# Patient Record
Sex: Male | Born: 1980 | Race: White | Hispanic: No | Marital: Married | Smoking: Current some day smoker
Health system: Southern US, Community
[De-identification: ages and names within clinical notes are randomized; demographics above are authoritative.]

## PROBLEM LIST (undated history)

## (undated) DIAGNOSIS — M5136 Other intervertebral disc degeneration, lumbar region: Secondary | ICD-10-CM

## (undated) DIAGNOSIS — M5126 Other intervertebral disc displacement, lumbar region: Secondary | ICD-10-CM

## (undated) DIAGNOSIS — M51369 Other intervertebral disc degeneration, lumbar region without mention of lumbar back pain or lower extremity pain: Secondary | ICD-10-CM

## (undated) HISTORY — PX: CLEFT LIP REPAIR: SHX5225

---

## 2006-08-04 ENCOUNTER — Emergency Department (HOSPITAL_COMMUNITY): Admission: EM | Admit: 2006-08-04 | Discharge: 2006-08-05 | Payer: Self-pay | Admitting: Emergency Medicine

## 2007-05-01 IMAGING — CR DG KNEE COMPLETE 4+V*R*
4 series · 4 of 4 positions shown · non-contrast
Comparison: None.

CLINICAL DATA: Pain, swelling and redness, has job laying tile. 
 RIGHT KNEE ? 4 VIEW:

[t knee ap right]
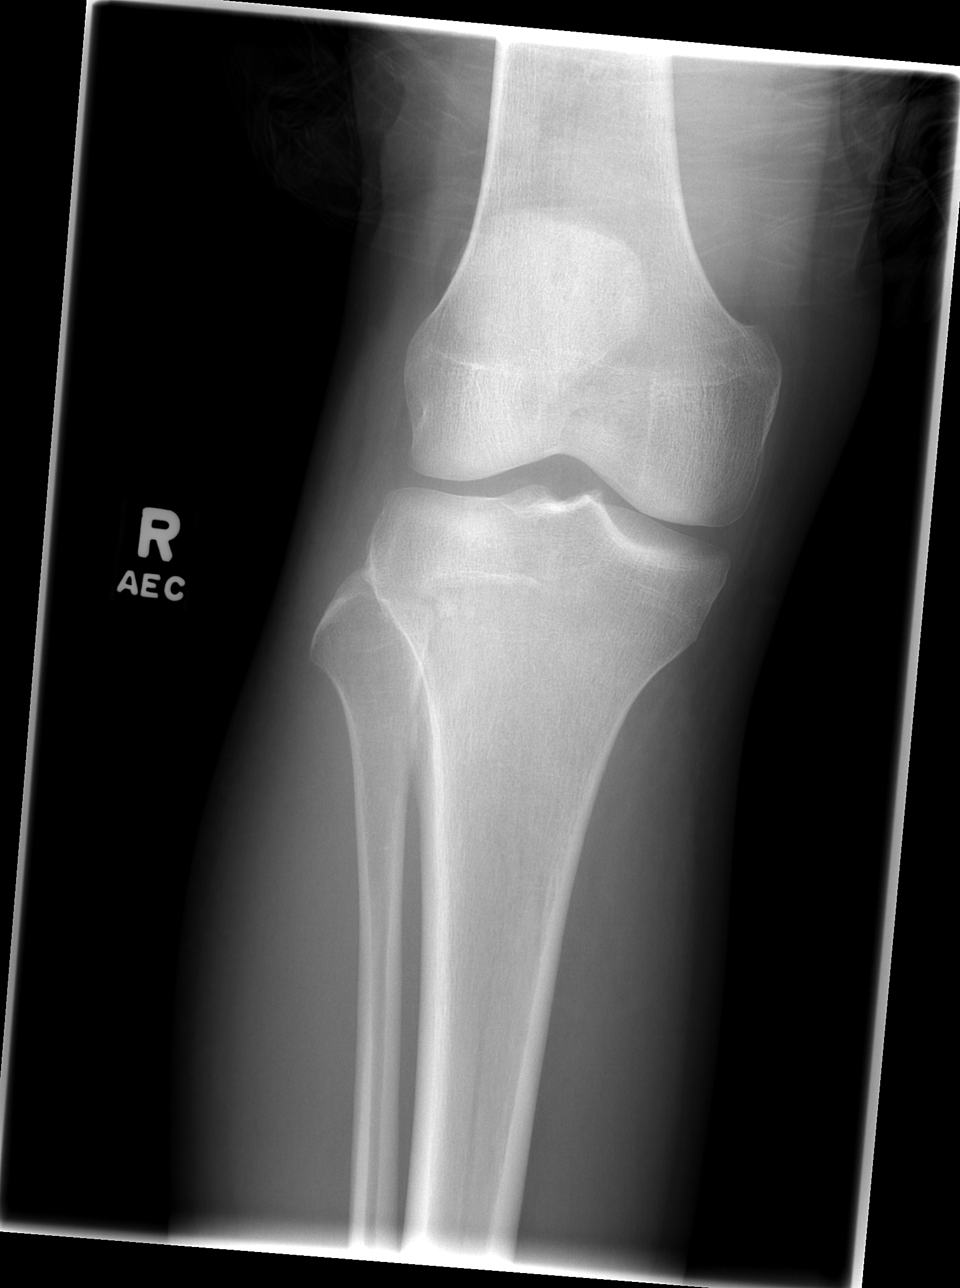

[t knee oblique right (1 of 2)]
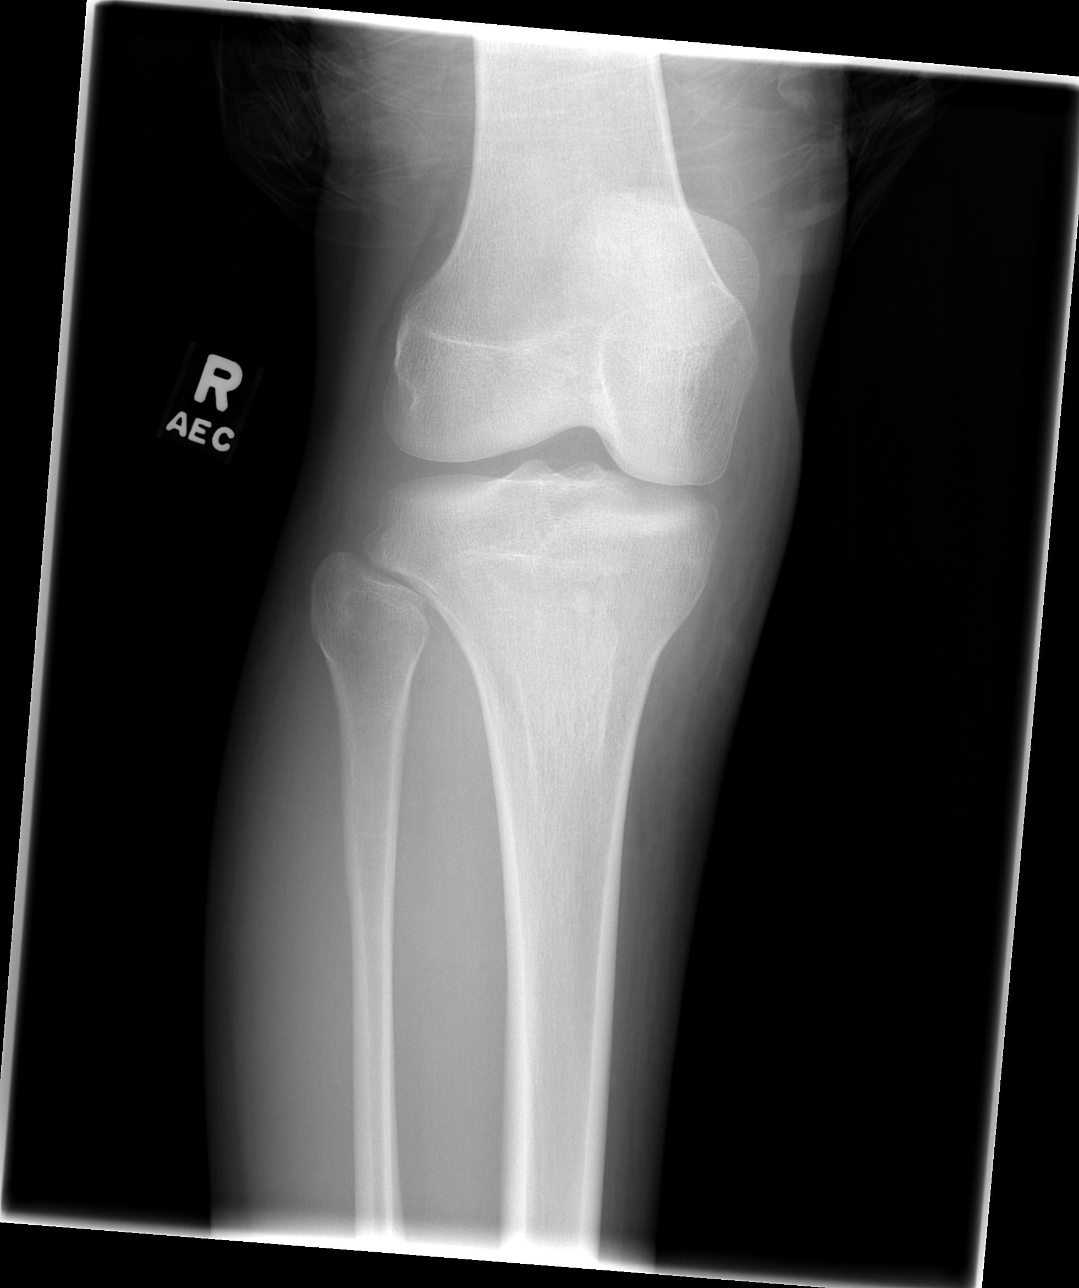

[t knee oblique right (2 of 2)]
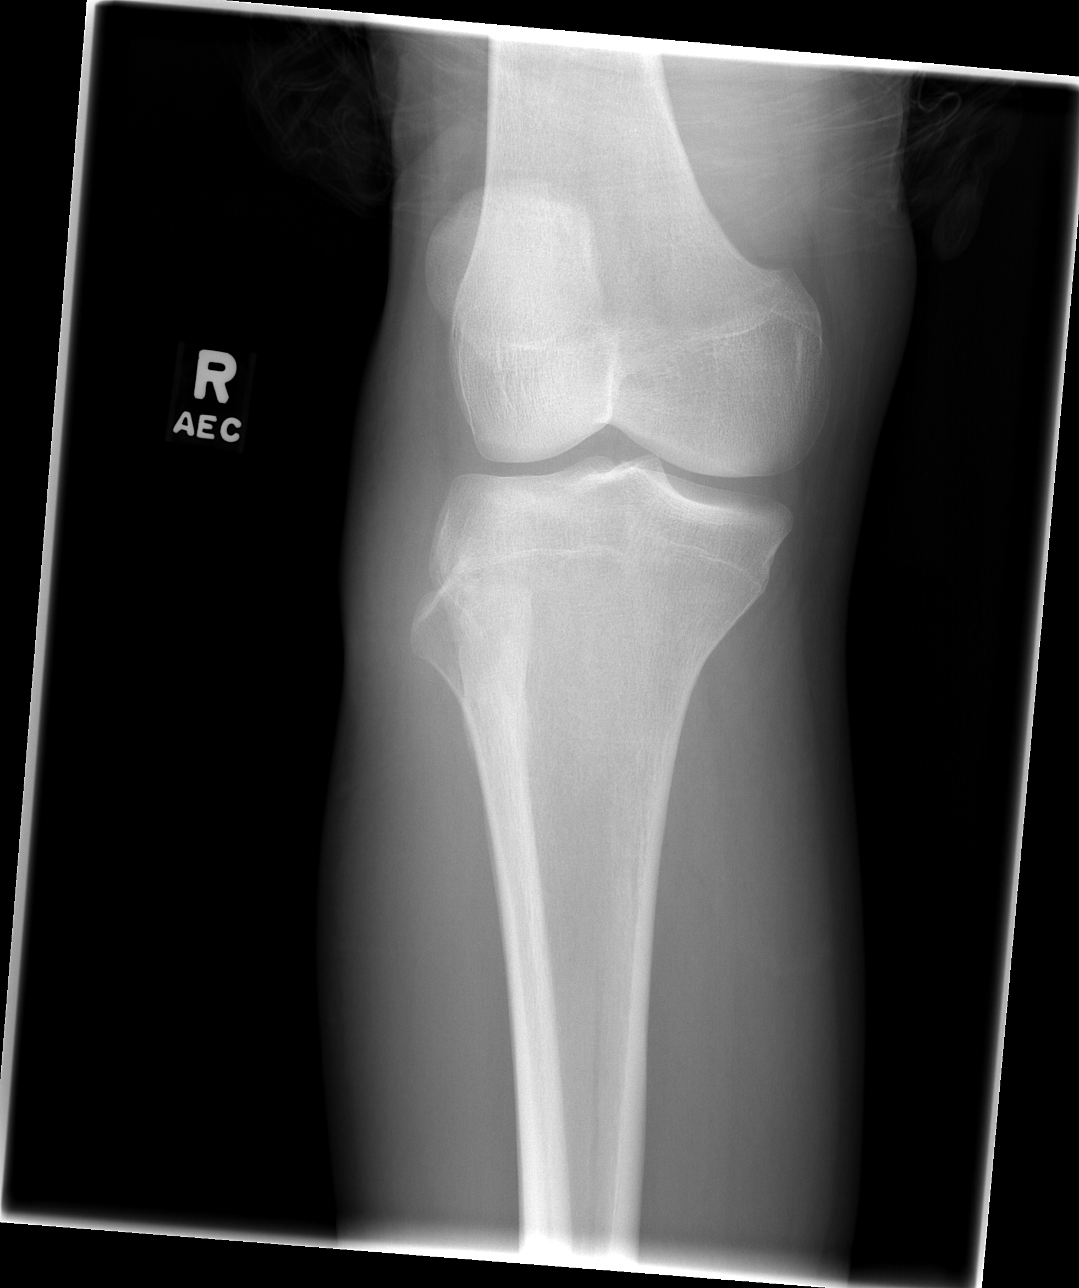

[t knee lat right]
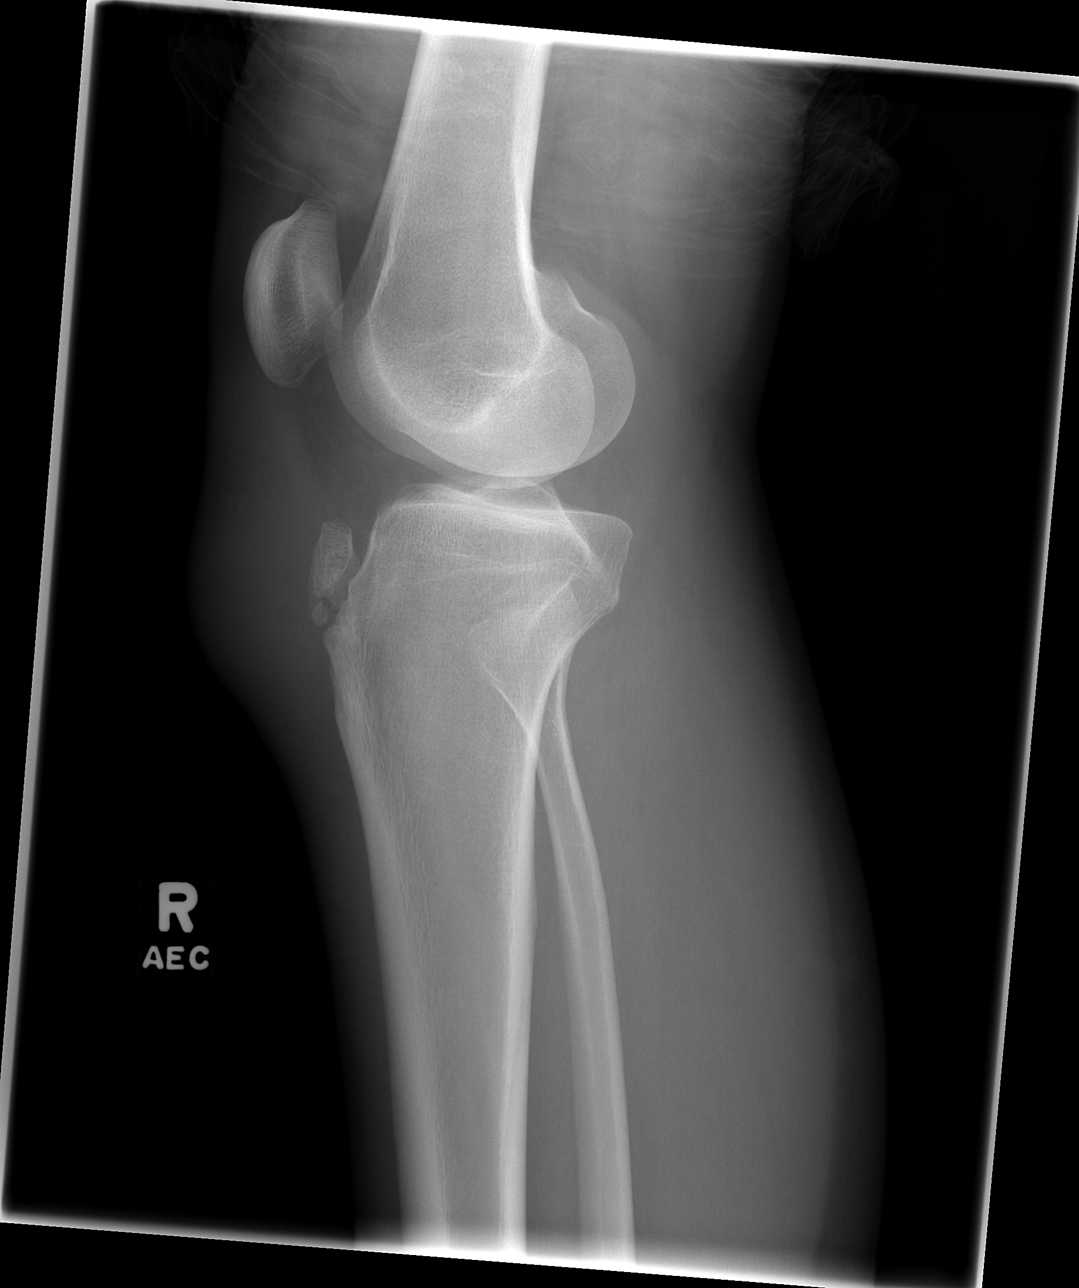

[4 of 4 positions shown; findings below may reference images not displayed]

FINDINGS: There is no joint effusion.  There is marked soft tissue swelling overlying the tibial tuberosity.  There is a chronic appearing avulsion of the tibial tuberosity.  No underlying evidence for osteomyelitis.  A nonemergent MRI would be recommended for further evaluation.
IMPRESSION: 1.  Marked soft tissue swelling anterior to tibial tuberosity.  
 2.  Chronic appearing avulsion injury involving the tibial tuberosity.  Recommend nonemergent MRI for further evaluation.

## 2012-06-24 ENCOUNTER — Emergency Department (HOSPITAL_COMMUNITY): Payer: Self-pay

## 2012-06-24 ENCOUNTER — Encounter (HOSPITAL_COMMUNITY): Payer: Self-pay | Admitting: Emergency Medicine

## 2012-06-24 ENCOUNTER — Observation Stay (HOSPITAL_COMMUNITY)
Admission: EM | Admit: 2012-06-24 | Discharge: 2012-06-25 | Disposition: A | Discharge status: Home / Self Care | Payer: Self-pay | Attending: Internal Medicine | Admitting: Internal Medicine

## 2012-06-24 DIAGNOSIS — L02619 Cutaneous abscess of unspecified foot: Principal | ICD-10-CM | POA: Insufficient documentation

## 2012-06-24 DIAGNOSIS — Z720 Tobacco use: Secondary | ICD-10-CM

## 2012-06-24 DIAGNOSIS — F17200 Nicotine dependence, unspecified, uncomplicated: Secondary | ICD-10-CM | POA: Insufficient documentation

## 2012-06-24 DIAGNOSIS — L03119 Cellulitis of unspecified part of limb: Secondary | ICD-10-CM | POA: Insufficient documentation

## 2012-06-24 DIAGNOSIS — S92919A Unspecified fracture of unspecified toe(s), initial encounter for closed fracture: Secondary | ICD-10-CM | POA: Insufficient documentation

## 2012-06-24 DIAGNOSIS — W268XXA Contact with other sharp object(s), not elsewhere classified, initial encounter: Secondary | ICD-10-CM | POA: Insufficient documentation

## 2012-06-24 DIAGNOSIS — L0390 Cellulitis, unspecified: Secondary | ICD-10-CM

## 2012-06-24 LAB — BASIC METABOLIC PANEL
BUN: 16 mg/dL (ref 6–23)
CO2: 24 mEq/L (ref 19–32)
Calcium: 9.6 mg/dL (ref 8.4–10.5)
Chloride: 100 mEq/L (ref 96–112)
Creatinine, Ser: 1 mg/dL (ref 0.50–1.35)

## 2012-06-24 LAB — CBC WITH DIFFERENTIAL/PLATELET
Basophils Absolute: 0 10*3/uL (ref 0.0–0.1)
Basophils Relative: 0 % (ref 0–1)
Eosinophils Absolute: 0.2 10*3/uL (ref 0.0–0.7)
Eosinophils Relative: 3 % (ref 0–5)
HCT: 46 % (ref 39.0–52.0)
Hemoglobin: 16.1 g/dL (ref 13.0–17.0)
MCH: 30.7 pg (ref 26.0–34.0)
MCHC: 35 g/dL (ref 30.0–36.0)
MCV: 87.6 fL (ref 78.0–100.0)
Monocytes Absolute: 0.8 10*3/uL (ref 0.1–1.0)
Monocytes Relative: 9 % (ref 3–12)
Neutro Abs: 5.3 10*3/uL (ref 1.7–7.7)
RDW: 13.2 % (ref 11.5–15.5)

## 2012-06-24 MED ORDER — OXYCODONE-ACETAMINOPHEN 5-325 MG PO TABS: 2.0000 | ORAL_TABLET | Freq: Once | ORAL | Status: DC

## 2012-06-24 MED ORDER — HYDROMORPHONE HCL PF 1 MG/ML IJ SOLN
1.0000 mg | Freq: Once | INTRAMUSCULAR | Status: AC
  Administered 2012-06-24: 1 mg via INTRAVENOUS
  Filled 2012-06-24: qty 1

## 2012-06-24 MED ORDER — CIPROFLOXACIN IN D5W 400 MG/200ML IV SOLN
400.0000 mg | Freq: Once | INTRAVENOUS | Status: AC
  Administered 2012-06-24: 400 mg via INTRAVENOUS
  Filled 2012-06-24: qty 200

## 2012-06-24 MED ORDER — SODIUM CHLORIDE 0.9 % IV BOLUS (SEPSIS)
1000.0000 mL | Freq: Once | INTRAVENOUS | Status: AC
  Administered 2012-06-24: 1000 mL via INTRAVENOUS

## 2012-06-24 NOTE — ED Notes (Signed)
Pt with redness, swelling to top of L foot. Pt has small abrasion to 4th toe on L foot.

## 2012-06-24 NOTE — ED Provider Notes (Signed)
History     CSN: [id number]  Arrival date & time 06/24/12  1752   First MD Initiated Contact with Patient 06/24/12 2200      Chief Complaint  Patient presents with  . Foot Injury    (Consider location/radiation/quality/duration/timing/severity/associated sxs/prior treatment) The history is provided by the patient and medical records.    Jesus Diaz is a 31 y.o. male presents to the emergency department with wound to the left fourth toe.  He should states he was trying to piece of plywood with the nail in that when the crowbar slipped and the piece of wood fell back onto his foot forcing the nail through his steel toed boot and down into the fourth digit of his left foot. He states he did try the nail out of his foot.  The nail did not there were no concern for fragments.  Began abruptly 2 days ago, things been persistent, gradually worsening.  Patient now has associated increasing pain and redness and swelling in the foot.  Patient denies fever, chills, chest pain, shortness of breath, vomiting, nausea, vomiting, diarrhea, weakness, dizziness, syncope.  Last tetanus shot 6 mos ago.    History reviewed. No pertinent past medical history.  Past Surgical History  Procedure Date  . Cleft lip repair     No family history on file.  History  Substance Use Topics  . Smoking status: Current Some Day Smoker  . Smokeless tobacco: Not on file  . Alcohol Use: No      Review of Systems  Constitutional: Negative for fever, diaphoresis, appetite change, fatigue and unexpected weight change.  HENT: Negative for mouth sores and neck stiffness.   Eyes: Negative for visual disturbance.  Respiratory: Negative for cough, chest tightness, shortness of breath and wheezing.   Cardiovascular: Negative for chest pain.  Gastrointestinal: Negative for nausea, vomiting, abdominal pain, diarrhea and constipation.  Genitourinary: Negative for dysuria, urgency, frequency and hematuria.  Musculoskeletal:  Positive for joint swelling, arthralgias and gait problem (2/2 pain). Negative for back pain.  Skin: Positive for color change and wound. Negative for rash.  Neurological: Negative for syncope, light-headedness and headaches.  Psychiatric/Behavioral: Negative for disturbed wake/sleep cycle. The patient is not nervous/anxious.   All other systems reviewed and are negative.    Allergies  Reglan  Home Medications   Current Outpatient Rx  Name Route Sig Dispense Refill  . IBUPROFEN 200 MG PO TABS Oral Take 400 mg by mouth every 6 (six) hours as needed. Pain      BP 112/62  Pulse 89  Temp 98 F (36.7 C) (Oral)  Resp 16  Wt 171 lb (77.565 kg)  SpO2 99%  Physical Exam  Nursing note and vitals reviewed. Constitutional: He appears well-developed and well-nourished. No distress.  HENT:  Head: Normocephalic and atraumatic.  Mouth/Throat: Oropharynx is clear and moist. No oropharyngeal exudate.  Eyes: Conjunctivae normal are normal. Pupils are equal, round, and reactive to light. No scleral icterus.  Neck: Normal range of motion. Neck supple.  Cardiovascular: Normal rate, regular rhythm, normal heart sounds and intact distal pulses.  Exam reveals no gallop and no friction rub.   No murmur heard. Pulmonary/Chest: Effort normal and breath sounds normal. No respiratory distress. He has no wheezes.  Musculoskeletal: He exhibits edema and tenderness.       Feet:  Neurological: He is alert.       Speech is clear and goal oriented Moves extremities without ataxia  Skin: Skin is warm and dry. No  rash noted. He is not diaphoretic. There is erythema.     Psychiatric: He has a normal mood and affect.    ED Course  Procedures (including critical care time)   Labs Reviewed  CBC WITH DIFFERENTIAL  BASIC METABOLIC PANEL   Dg Foot Complete Left  06/24/2012  *RADIOLOGY REPORT*  Clinical Data: Pain post trauma  LEFT FOOT - COMPLETE 3+ VIEW  Comparison: None.  Findings: Frontal, oblique,  and lateral views were obtained.  There is an incomplete fracture in the distal portion of the fourth proximal phalanx extending parallel with the cortex.  No other fracture.  No dislocation.  No radiopaque foreign body.  Joint spaces appear intact.  No bony destruction.  IMPRESSION: Complete fracture fourth proximal phalanx as described.  No other fracture.  No bony destruction.  No radiopaque foreign body.   Original Report Authenticated By: Bretta Bang, M.D.      1. Cellulitis   2. Toe fracture   3. Tobacco abuse       MDM  Floyce Stakes the left fourth toe injury and cellulitis. Concern for high risk of osteomyelitis. Pt started on Cipro IV, fluids and pain medications.  Will obtain basic labs consult to ortho.  Discussed patient with Dr Ophelia Charter agrees with decision to admit for IV antibiotics.  Will proceed with admission    Dierdre Forth, PA-C 06/25/12 0111

## 2012-06-24 NOTE — ED Notes (Signed)
Pt alert, arrives from home, c/o pain in left foot, onset was yesterday, injured at work, "shot with nail", area red swollen, pms intact, ambulates to triage, resp even unlabored, skin pwd

## 2012-06-24 NOTE — ED Notes (Signed)
Pt ambulatory to treatment room.  

## 2012-06-24 NOTE — ED Notes (Signed)
Pt requested to lock up his car and get things out of his car since his location in the ED changed from fast track to being roomed. Pt escorted by security.

## 2012-06-24 NOTE — ED Notes (Signed)
RN request to obtain labs with the start of IV 

## 2012-06-25 ENCOUNTER — Encounter (HOSPITAL_COMMUNITY): Payer: Self-pay | Admitting: Internal Medicine

## 2012-06-25 DIAGNOSIS — S92919A Unspecified fracture of unspecified toe(s), initial encounter for closed fracture: Secondary | ICD-10-CM | POA: Diagnosis present

## 2012-06-25 DIAGNOSIS — Z720 Tobacco use: Secondary | ICD-10-CM

## 2012-06-25 DIAGNOSIS — L03119 Cellulitis of unspecified part of limb: Secondary | ICD-10-CM | POA: Diagnosis present

## 2012-06-25 MED ORDER — ENOXAPARIN SODIUM 40 MG/0.4ML ~~LOC~~ SOLN
40.0000 mg | SUBCUTANEOUS | Status: DC
  Administered 2012-06-25: 40 mg via SUBCUTANEOUS
  Filled 2012-06-25 (×2): qty 0.4

## 2012-06-25 MED ORDER — VANCOMYCIN HCL 1000 MG IV SOLR
1500.0000 mg | Freq: Two times a day (BID) | INTRAVENOUS | Status: DC
  Administered 2012-06-25: 1500 mg via INTRAVENOUS
  Filled 2012-06-25: qty 1500

## 2012-06-25 MED ORDER — HYDROMORPHONE HCL PF 1 MG/ML IJ SOLN
1.0000 mg | INTRAMUSCULAR | Status: DC | PRN
  Administered 2012-06-25 (×6): 1 mg via INTRAVENOUS
  Filled 2012-06-25 (×6): qty 1

## 2012-06-25 MED ORDER — DIPHENHYDRAMINE HCL 25 MG PO CAPS
25.0000 mg | ORAL_CAPSULE | Freq: Four times a day (QID) | ORAL | Status: DC | PRN
  Administered 2012-06-25 (×2): 25 mg via ORAL
  Filled 2012-06-25 (×2): qty 1

## 2012-06-25 MED ORDER — PIPERACILLIN-TAZOBACTAM 3.375 G IVPB 30 MIN: 3.3750 g | Freq: Three times a day (TID) | INTRAVENOUS | Status: DC

## 2012-06-25 MED ORDER — PIPERACILLIN-TAZOBACTAM 3.375 G IVPB
3.3750 g | Freq: Three times a day (TID) | INTRAVENOUS | Status: DC
  Administered 2012-06-25 (×2): 3.375 g via INTRAVENOUS
  Filled 2012-06-25 (×3): qty 50

## 2012-06-25 MED ORDER — DEXTROSE-NACL 5-0.9 % IV SOLN
INTRAVENOUS | Status: DC
  Administered 2012-06-25 (×2): via INTRAVENOUS

## 2012-06-25 MED ORDER — DOXYCYCLINE HYCLATE 50 MG PO CAPS: 100.0000 mg | ORAL_CAPSULE | Freq: Two times a day (BID) | ORAL | Status: AC

## 2012-06-25 MED ORDER — ACETAMINOPHEN 325 MG PO TABS: 650.0000 mg | ORAL_TABLET | Freq: Four times a day (QID) | ORAL | Status: DC | PRN

## 2012-06-25 MED ORDER — ACETAMINOPHEN 650 MG RE SUPP: 650.0000 mg | Freq: Four times a day (QID) | RECTAL | Status: DC | PRN

## 2012-06-25 MED ORDER — HYDROCODONE-ACETAMINOPHEN 10-325 MG PO TABS: 1.0000 | ORAL_TABLET | Freq: Four times a day (QID) | ORAL | Status: DC | PRN

## 2012-06-25 MED ORDER — LORAZEPAM 2 MG/ML IJ SOLN
1.0000 mg | INTRAMUSCULAR | Status: DC | PRN
  Administered 2012-06-25 (×2): via INTRAVENOUS
  Administered 2012-06-25: 1 mg via INTRAVENOUS
  Filled 2012-06-25 (×3): qty 1

## 2012-06-25 MED ORDER — IBUPROFEN 800 MG PO TABS: 400.0000 mg | ORAL_TABLET | Freq: Four times a day (QID) | ORAL | Status: DC | PRN

## 2012-06-25 MED ORDER — NICOTINE 14 MG/24HR TD PT24
14.0000 mg | MEDICATED_PATCH | Freq: Every day | TRANSDERMAL | Status: DC
  Administered 2012-06-25: 14 mg via TRANSDERMAL
  Filled 2012-06-25 (×2): qty 1

## 2012-06-25 NOTE — Discharge Summary (Signed)
Physician Discharge Summary  Jesus Diaz ZOX:[id number] DOB: 1980/09/25 DOA: 06/24/2012  PCP: No primary provider on file.  Admit date: 06/24/2012 Discharge date: 06/25/2012  Recommendations for Outpatient Follow-up:  1. With Dr. Ophelia Charter of ortho per scheduled appointment  Discharge Diagnoses:  Principal Problem:  *Cellulitis and abscess of foot Active Problems:  Toe fracture  Tobacco abuse  Discharge Condition: medically stable for discharge home today  Diet recommendation: as tolerated  History of present illness:  31 year old male presented with pain and swelling of his left foot. He suffered a puntured nail wound thru his boot 1 day prior to admission. ED evaluation reveal toe fracture and ortho consult was requested from ED physician. I called ortho and they said no intervention is necessary for toe fracture; they will order a boot and patient to follow up in their office per scheduled appointment.  Assessment/Plan:   Principal Problem:  *Cellulitis and abscess of foot  - D/C with doxycycline 100 mg BID for 14 days - Norco 10-325 mg Q 6 hours PRN moderate pain  Active Problems:  Toe fracture  - per ortho   Code Status: full code  Family Communication: no family at bedside  Disposition Plan: home today  Manson Passey, MD  [facility]  Pager [phone]   Discharge Exam: Filed Vitals:   06/25/12 1412  BP: 120/71  Pulse: 95  Temp: 98.1 F (36.7 C)  Resp: 16   Filed Vitals:   06/24/12 1934 06/25/12 0212 06/25/12 0220 06/25/12 1412  BP: 112/62 115/67 125/62 120/71  Pulse: 89 73 81 95  Temp: 98 F (36.7 C)  98 F (36.7 C) 98.1 F (36.7 C)  TempSrc: Oral  Oral Oral  Resp: 16 16 18 16   Height:   5\' 11"  (1.803 m)   Weight: 77.565 kg (171 lb)  79.379 kg (175 lb)   SpO2: 99% 96% 100% 98%    General: Pt is alert, follows commands appropriately, not in acute distress Cardiovascular: Regular rate and rhythm, S1/S2 +, no murmurs, no rubs, no gallops Respiratory: Clear to  auscultation bilaterally, no wheezing, no crackles, no rhonchi Abdominal: Soft, non tender, non distended, bowel sounds +, no guarding Extremities: no edema, no cyanosis, pulses palpable bilaterally DP and PT Neuro: Grossly nonfocal  Discharge Instructions  Discharge Orders    Future Orders Please Complete By Expires   Diet - low sodium heart healthy      Increase activity slowly      Call MD for:  persistant nausea and vomiting      Call MD for:  severe uncontrolled pain      Call MD for:  difficulty breathing, headache or visual disturbances      Call MD for:  persistant dizziness or light-headedness          Medication List     As of 06/25/2012  3:21 PM    TAKE these medications         doxycycline 50 MG capsule   Commonly known as: VIBRAMYCIN   Take 2 capsules (100 mg total) by mouth 2 (two) times daily.      HYDROcodone-acetaminophen 10-325 MG per tablet   Commonly known as: NORCO   Take 1 tablet by mouth every 6 (six) hours as needed for pain.      ibuprofen 200 MG tablet   Commonly known as: ADVIL,MOTRIN   Take 400 mg by mouth every 6 (six) hours as needed. Pain  Follow-up Information    Follow up with YATES,MARK C, MD. Call in 5 days.   Contact information:   [address] [place] [address] [phone]           The results of significant diagnostics from this hospitalization (including imaging, microbiology, ancillary and laboratory) are listed below for reference.    Significant Diagnostic Studies: Dg Foot Complete Left 06/24/2012  *RADIOLOGY REPORT*  Clinical Data: Pain post trauma  LEFT FOOT - COMPLETE 3+ VIEW  Comparison: None.  Findings: Frontal, oblique, and lateral views were obtained.  There is an incomplete fracture in the distal portion of the fourth proximal phalanx extending parallel with the cortex.  No other fracture.  No dislocation.  No radiopaque foreign body.  Joint spaces appear intact.  No bony destruction.   IMPRESSION: Complete fracture fourth proximal phalanx as described.  No other fracture.  No bony destruction.  No radiopaque foreign body.   Original Report Authenticated By: Bretta Bang, M.D.     Labs: Basic Metabolic Panel:  Lab 06/24/12 9147  NA 136  K 4.0  CL 100  CO2 24  GLUCOSE 86  BUN 16  CREATININE 1.00  CALCIUM 9.6   CBC:  Lab 06/24/12 2259  WBC 8.5  HGB 16.1  HCT 46.0  MCV 87.6  PLT 255    Time coordinating discharge: Over 30 minutes  Signed:  Manson Passey, MD  [facility] 06/25/2012, 3:21 PM  Pager #: [phone]

## 2012-06-25 NOTE — Progress Notes (Deleted)
Pt having a lot of anxiety.  Giving ativan as ordered which is helping.  However, pt having worsening nightmares and difficulty telling the difference between dreams and what is actually happening to him.  Pt also mentioned being very afraid of the upcoming surgery.  Pt VS WNL.  Will continue to monitor. Jesus Diaz [place] 4:55 AM 06/25/2012

## 2012-06-25 NOTE — Progress Notes (Signed)
ANTIBIOTIC CONSULT NOTE - INITIAL  Pharmacy Consult for Vancomycin Indication: cellulitis  Allergies  Allergen Reactions  . Reglan (Metoclopramide) Anxiety    Patient Measurements: Height: 5\' 11"  (180.3 cm) Weight: 175 lb (79.379 kg) IBW/kg (Calculated) : 75.3  Adjusted Body Weight:   Vital Signs: Temp: 98 F (36.7 C) (11/02 0220) Temp src: Oral (11/02 0220) BP: 125/62 mmHg (11/02 0220) Pulse Rate: 81  (11/02 0220) Intake/Output from previous day: 11/01 0701 - 11/02 0700 In: 1210 [I.V.:1210] Out: -  Intake/Output from this shift:    Labs:  [facility] 06/24/12 2259  WBC 8.5  HGB 16.1  PLT 255  LABCREA --  CREATININE 1.00   Estimated Creatinine Clearance: 114 ml/min (by C-G formula based on Cr of 1). No results found for this basename: VANCOTROUGH:2,VANCOPEAK:2,VANCORANDOM:2,GENTTROUGH:2,GENTPEAK:2,GENTRANDOM:2,TOBRATROUGH:2,TOBRAPEAK:2,TOBRARND:2,AMIKACINPEAK:2,AMIKACINTROU:2,AMIKACIN:2, in the last 72 hours   Microbiology: No results found for this or any previous visit (from the past 720 hour(s)).  Medical History: History reviewed. No pertinent past medical history.  Medications:  Scheduled:    . ciprofloxacin  400 mg Intravenous Once  . enoxaparin (LOVENOX) injection  40 mg Subcutaneous Q24H  .  HYDROmorphone (DILAUDID) injection  1 mg Intravenous Once  . nicotine  14 mg Transdermal Daily  . piperacillin-tazobactam (ZOSYN)  IV  3.375 g Intravenous Q8H  . sodium chloride  1,000 mL Intravenous Once  . DISCONTD: oxyCODONE-acetaminophen  2 tablet Oral Once  . DISCONTD: piperacillin-tazobactam  3.375 g Intravenous Q8H   Infusions:    . dextrose 5 % and 0.9% NaCl 125 mL/hr at 06/25/12 1143   PRN: acetaminophen, acetaminophen, diphenhydrAMINE, HYDROmorphone (DILAUDID) injection, ibuprofen, LORazepam Assessment:  31 yo M with cellulitis of foot.   Symptoms of pain / swelling and noted to have had puncture of nail through boot 1 day PTA  Goal of Therapy:    Vancomycin trough level 10-15 mcg/ml  Plan:   Vancomycin 1500 mg IV q 12 hours  Measure antibiotic drug levels at steady state  Follow up culture results  Loletta Specter 06/25/2012,12:00 PM

## 2012-06-25 NOTE — Progress Notes (Signed)
[facility] PROGRESS NOTE  Mariusz Jubb ZOX:[id number] DOB: [birth year] DOA: 06/24/2012 PCP: No primary provider on file.  Brief narrative: 31 year old male presented with pain and swelling of his left foot. He suffered a puntured nail wound thru his boot 1 day prior to admission. ED evaluation reveal toe fracture and ortho consult was requested from ED physician.  Assessment/Plan:  Principal Problem:  *Cellulitis and abscess of foot - I have added vancomycin to zosyn for gram positive coverage  Active Problems:  Toe fracture - per ortho - continue current analgesia   Code Status: full code Family Communication: no family at bedside Disposition Plan: home when stable  Manson Passey, MD  [facility] Pager [phone]  If 7PM-7AM, please contact night-coverage [email or web address] Password [email or web address] 06/25/2012, 8:42 AM   LOS: 1 day   Consultants:  Ortho   Procedures:  None   Antibiotics:  vanco  zosyn  HPI/Subjective: No acute events since admission.  Objective: Filed Vitals:   06/24/12 1934 06/25/12 0212 06/25/12 0220  BP: 112/62 115/67 125/62  Pulse: 89 73 81  Temp: 98 F (36.7 C)  98 F (36.7 C)  TempSrc: Oral  Oral  Resp: 16 16 18   Height:   5\' 11"  (1.803 m)  Weight: 77.565 kg (171 lb)  79.379 kg (175 lb)  SpO2: 99% 96% 100%    Intake/Output Summary (Last 24 hours) at 06/25/12 0842 Last data filed at 06/25/12 8295  Gross per 24 hour  Intake   1210 ml  Output      0 ml  Net   1210 ml    Exam:   General:  Pt is alert, follows commands appropriately, not in acute distress  Cardiovascular: Regular rate and rhythm, S1/S2, no murmurs, no rubs, no gallops  Respiratory: Clear to auscultation bilaterally, no wheezing, no crackles, no rhonchi  Abdomen: Soft, non tender, non distended, bowel sounds present, no guarding  Extremities: left foot swelling, pulses DP and PT palpable bilaterally  Neuro: Grossly nonfocal  Data Reviewed: Basic Metabolic  Panel:  Lab 06/24/12 2259  NA 136  K 4.0  CL 100  CO2 24  GLUCOSE 86  BUN 16  CREATININE 1.00  CALCIUM 9.6   CBC:  Lab 06/24/12 2259  WBC 8.5  HGB 16.1  HCT 46.0  MCV 87.6  PLT 255    Studies: Dg Foot Complete Left 06/24/2012   IMPRESSION: Complete fracture fourth proximal phalanx as described.  No other fracture.  No bony destruction.  No radiopaque foreign body.   Original Report Authenticated By: Bretta Bang, M.D.     Scheduled Meds:  . enoxaparin (LOVENOX)   40 mg Subcutaneous Q24H  . piperacillin-tazobactam  3.375 g Intravenous Q8H

## 2012-06-25 NOTE — H&P (Signed)
[facility] History and Physical  Markcus Diaz ION:[id number] DOB: 10-29-1980    PCP:   None  Chief Complaint: cellulitis of the left foot.  HPI: Jesus Diaz is an 31 y.o. male with last DTap 6 months ago, presents to the ER with pain and swelling of his left foot.  He suffered a puntured nail wound thru his boot yesterday.  There has been redness proximally to his left foot. He denied any fever, chills, and has been working thru his injury today.  Evaluation in the ER included normal labs.  His Xray showed fracture of the 4th proximal phalange. Orthopedic Dr Ophelia Charter consulted by the EDP, and hospitalist was asked to admit this patient.  He was given IV Cipro in the ER.  Rewiew of Systems:  Constitutional: Negative for malaise, fever and chills. No significant weight loss or weight gain Eyes: Negative for eye pain, redness and discharge, diplopia, visual changes, or flashes of light. ENMT: Negative for ear pain, hoarseness, nasal congestion, sinus pressure and sore throat. No headaches; tinnitus, drooling, or problem swallowing. Cardiovascular: Negative for chest pain, palpitations, diaphoresis, dyspnea and peripheral edema. ; No orthopnea, PND Respiratory: Negative for cough, hemoptysis, wheezing and stridor. No pleuritic chestpain. Gastrointestinal: Negative for nausea, vomiting, diarrhea, constipation, abdominal pain, melena, blood in stool, hematemesis, jaundice and rectal bleeding.    Genitourinary: Negative for frequency, dysuria, incontinence,flank pain and hematuria; Musculoskeletal: Negative for back pain and neck pain.  Skin: . Negative for pruritus, rash, abrasions, bruising and skin lesion.; ulcerations Neuro: Negative for headache, lightheadedness and neck stiffness. Negative for weakness, altered level of consciousness , altered mental status, extremity weakness, burning feet, involuntary movement, seizure and syncope.  Psych: negative for anxiety, depression, insomnia,  tearfulness, panic attacks, hallucinations, paranoia, suicidal or homicidal ideation    History reviewed. No pertinent past medical history.  Past Surgical History  Procedure Date  . Cleft lip repair     Medications:  HOME MEDS: Prior to Admission medications   Medication Sig Start Date End Date Taking? Authorizing Provider  ibuprofen (ADVIL,MOTRIN) 200 MG tablet Take 400 mg by mouth every 6 (six) hours as needed. Pain   Yes Historical Provider, MD     Allergies:  Allergies  Allergen Reactions  . Reglan (Metoclopramide) Anxiety    Social History: He drinks 2 beers per day.  He does smoke cigarettes.  He is married with four children.  Family History:  No family hx of DM, or immunological disorder.   Physical Exam: Filed Vitals:   06/24/12 1934  BP: 112/62  Pulse: 89  Temp: 98 F (36.7 C)  TempSrc: Oral  Resp: 16  Weight: 77.565 kg (171 lb)  SpO2: 99%   Blood pressure 112/62, pulse 89, temperature 98 F (36.7 C), temperature source Oral, resp. rate 16, weight 77.565 kg (171 lb), SpO2 99.00%.  GEN:  Pleasant patient lying in the stretcher in no acute distress; cooperative with exam. PSYCH:  alert and oriented x4; does not appear anxious or depressed; affect is appropriate. HEENT: Mucous membranes pink and anicteric; PERRLA; EOM intact; no cervical lymphadenopathy nor thyromegaly or carotid bruit; no JVD; There were no stridor. Neck is very supple. Breasts:: Not examined CHEST WALL: No tenderness CHEST: Normal respiration, clear to auscultation bilaterally.  HEART: Regular rate and rhythm.  There are no murmur, rub, or gallops.   BACK: No kyphosis or scoliosis; no CVA tenderness ABDOMEN: soft and non-tender; no masses, no organomegaly, normal abdominal bowel sounds; no pannus; no intertriginous candida. There  is no rebound and no distention. Rectal Exam: Not done EXTREMITIES: No bone or joint deformity; age-appropriate arthropathy of the hands and knees; there is  swelling of the left foot. Very small puncture wound.  I marked the foot with date and time.; no ulcerations.  There is no calf tenderness. Genitalia: not examined PULSES: 2+ and symmetric SKIN: Normal hydration no rash or ulceration CNS: Cranial nerves 2-12 grossly intact no focal lateralizing neurologic deficit.  Speech is fluent; uvula elevated with phonation, facial symmetry and tongue midline. DTR are normal bilaterally, cerebella exam is intact, barbinski is negative and strengths are equaled bilaterally.  No sensory loss.   Labs on Admission:  Basic Metabolic Panel:  Lab 06/24/12 2130  NA 136  K 4.0  CL 100  CO2 24  GLUCOSE 86  BUN 16  CREATININE 1.00  CALCIUM 9.6  MG --  PHOS --   Liver Function Tests: No results found for this basename: AST:5,ALT:5,ALKPHOS:5,BILITOT:5,PROT:5,ALBUMIN:5 in the last 168 hours No results found for this basename: LIPASE:5,AMYLASE:5 in the last 168 hours No results found for this basename: AMMONIA:5 in the last 168 hours CBC:  Lab 06/24/12 2259  WBC 8.5  NEUTROABS 5.3  HGB 16.1  HCT 46.0  MCV 87.6  PLT 255   Cardiac Enzymes: No results found for this basename: CKTOTAL:5,CKMB:5,CKMBINDEX:5,TROPONINI:5 in the last 168 hours  CBG: No results found for this basename: GLUCAP:5 in the last 168 hours   Radiological Exams on Admission: Dg Foot Complete Left  06/24/2012  *RADIOLOGY REPORT*  Clinical Data: Pain post trauma  LEFT FOOT - COMPLETE 3+ VIEW  Comparison: None.  Findings: Frontal, oblique, and lateral views were obtained.  There is an incomplete fracture in the distal portion of the fourth proximal phalanx extending parallel with the cortex.  No other fracture.  No dislocation.  No radiopaque foreign body.  Joint spaces appear intact.  No bony destruction.  IMPRESSION: Complete fracture fourth proximal phalanx as described.  No other fracture.  No bony destruction.  No radiopaque foreign body.   Original Report Authenticated By: Bretta Bang, M.D.      Assessment/Plan Present on Admission:  .Toe fracture .Cellulitis and abscess of foot   PLAN:  Cellulitis with "open fracture".  Will tx with Zosyn IV and give pain meds.  He is up to date with his tetanus shot.  Await further recommendation from Ortho. I will have him NPO after midnight, but doubtful he will need any surgery with this injury. He is stable, full code, and will be admitted to [facility] service.  Will give some ativan for his anxiety.  Other plans as per orders.  Code Status: FULL Unk Lightning, MD. [facility] Pager [phone] 7pm to 7am.  06/25/2012, 12:41 AM

## 2012-06-25 NOTE — Progress Notes (Signed)
Discharge teaching completed. Prescriptions given for Norco and Doxycycline. Also provided handouts. Pt. Understands information. Handout given and reviewed on Cellulitis. Instructed pt. To F/U with Dr. Ophelia Charter in 5 days. Discharge instruction done by teach back. Ortho tech in for post op boot. Pt. States it fits well. Pt. Left via ambulation. No resp. distress noted. Gait steady with boot to left foot.

## 2012-06-25 NOTE — Progress Notes (Signed)
Pt is c/o itching related to dilaudid.  Notified NP.  Received new orders.  Will continue to monitor pt. Daphene Calamity [place] 5:16 AM 06/25/2012

## 2012-06-27 NOTE — ED Provider Notes (Signed)
Medical screening examination/treatment/procedure(s) were performed by non-physician practitioner and as supervising physician I was immediately available for consultation/collaboration.   Aiza Vollrath L Brandis Wixted, MD 06/27/12 0712 

## 2012-06-28 ENCOUNTER — Encounter (HOSPITAL_COMMUNITY): Payer: Self-pay

## 2013-03-21 IMAGING — CR DG FOOT COMPLETE 3+V*L*
3 series · 3 of 3 positions shown · non-contrast
Comparison: None.

CLINICAL DATA: Pain post trauma

LEFT FOOT - COMPLETE 3+ VIEW

[x foot ap left]
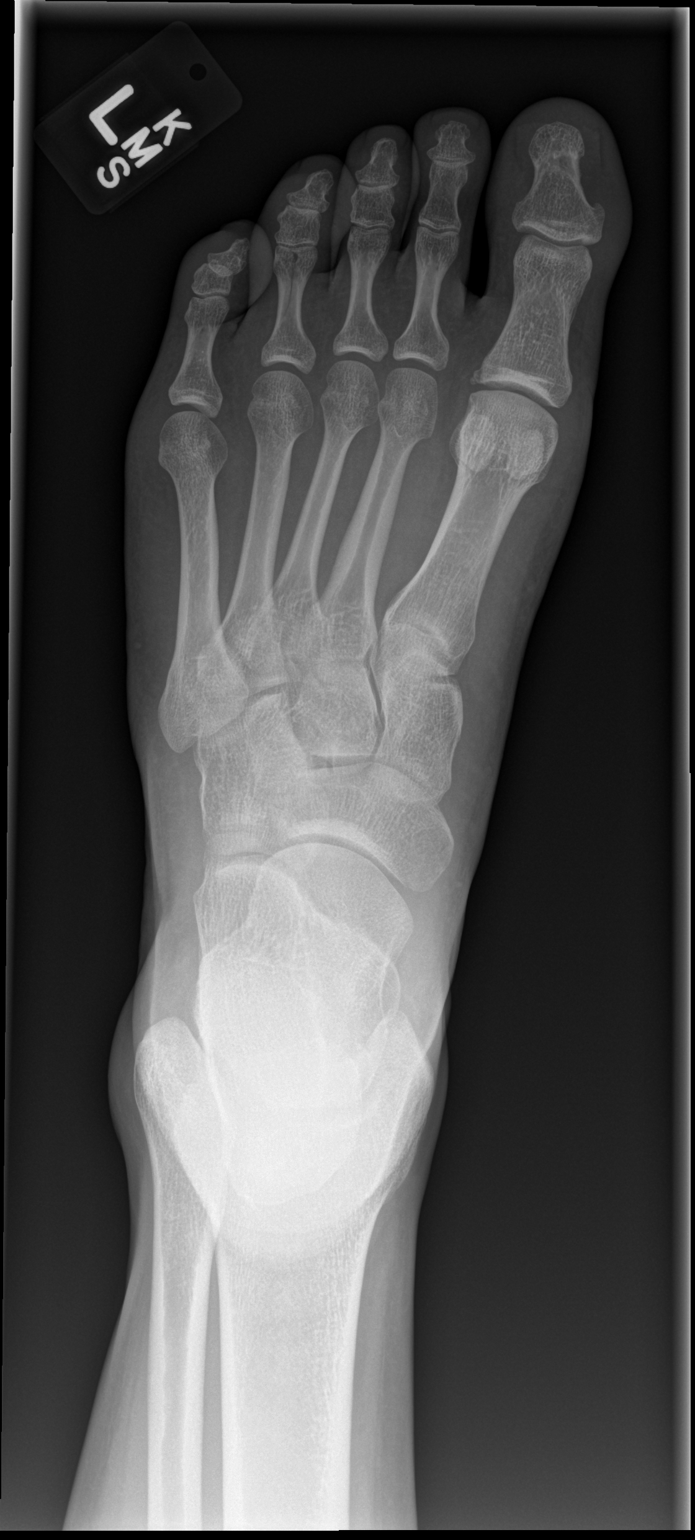

[x foot lat left (1 of 2)]
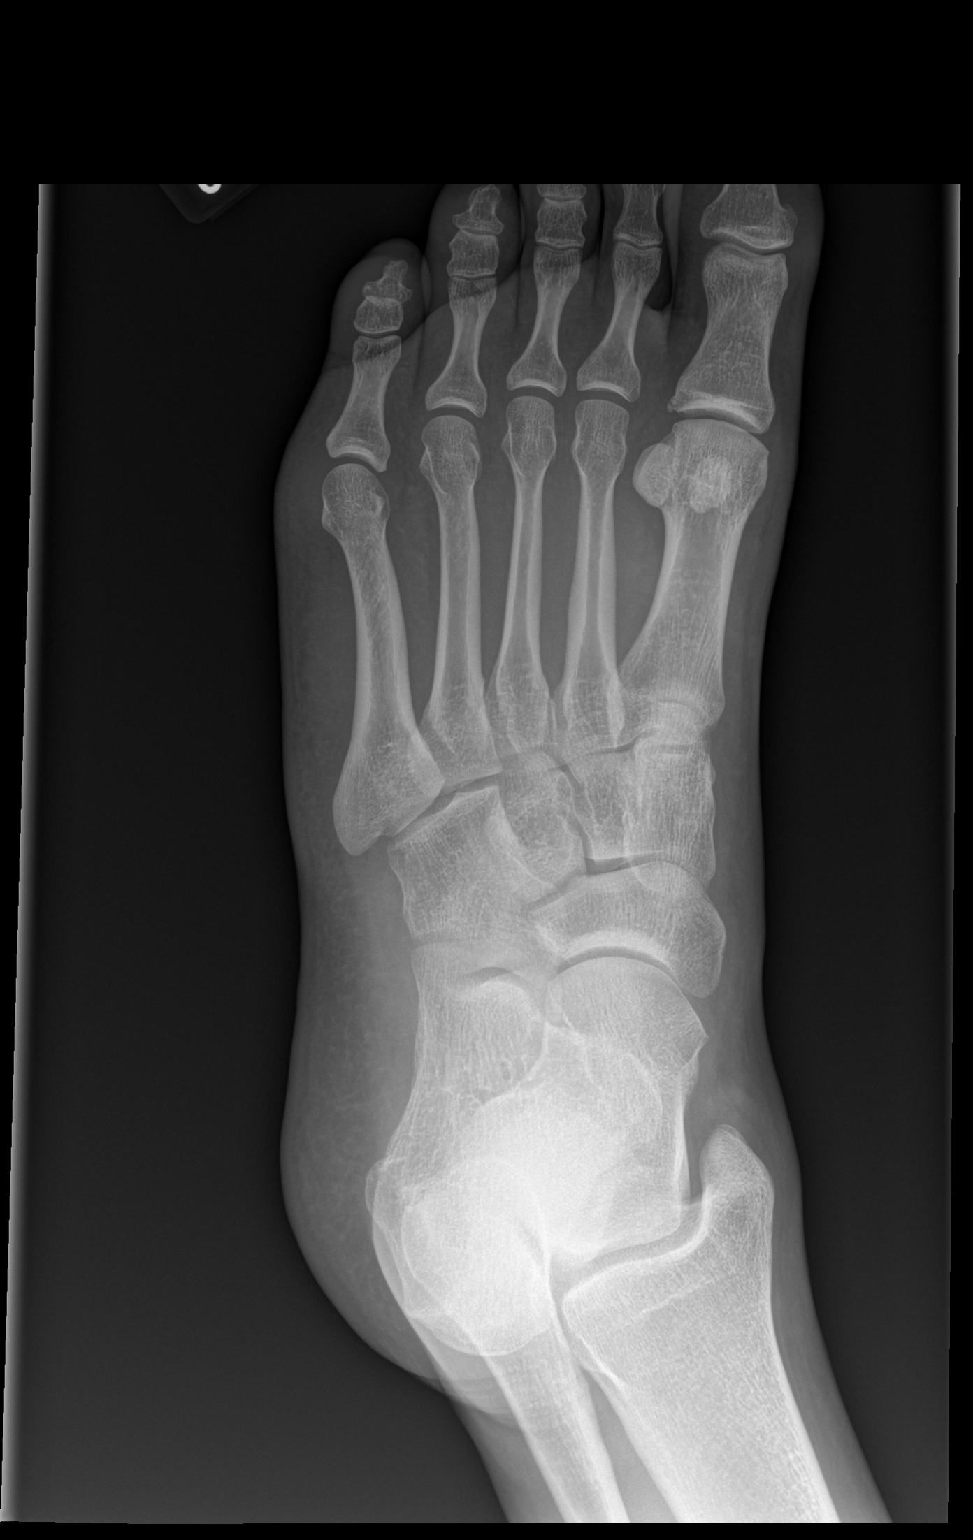

[x foot lat left (2 of 2)]
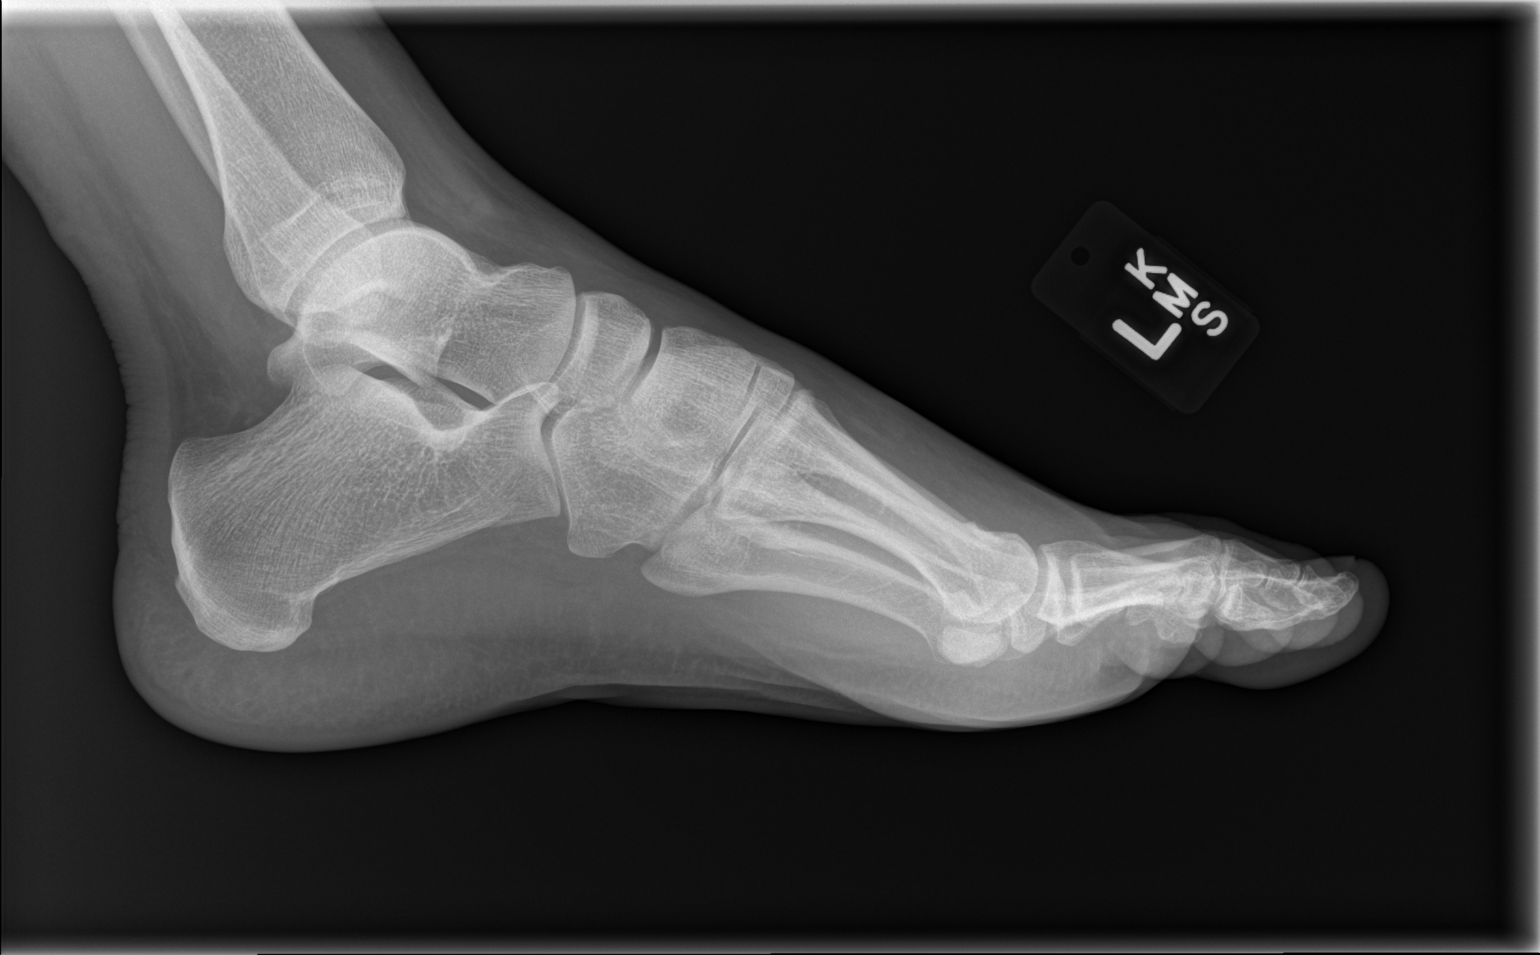

[3 of 3 positions shown; findings below may reference images not displayed]

FINDINGS: Frontal, oblique, and lateral views were obtained.  There
is an incomplete fracture in the distal portion of the fourth
proximal phalanx extending parallel with the cortex.  No other
fracture.  No dislocation.  No radiopaque foreign body.  Joint
spaces appear intact.  No bony destruction.
IMPRESSION: Complete fracture fourth proximal phalanx as described.  No other
fracture.  No bony destruction.  No radiopaque foreign body.

## 2014-09-25 ENCOUNTER — Emergency Department (HOSPITAL_COMMUNITY)
Admission: EM | Admit: 2014-09-25 | Discharge: 2014-09-25 | Disposition: A | Discharge status: Home / Self Care | Payer: Self-pay | Attending: Emergency Medicine | Admitting: Emergency Medicine

## 2014-09-25 ENCOUNTER — Encounter (HOSPITAL_COMMUNITY): Payer: Self-pay | Admitting: Adult Health

## 2014-09-25 DIAGNOSIS — M549 Dorsalgia, unspecified: Secondary | ICD-10-CM

## 2014-09-25 DIAGNOSIS — M545 Low back pain: Secondary | ICD-10-CM | POA: Insufficient documentation

## 2014-09-25 DIAGNOSIS — Z792 Long term (current) use of antibiotics: Secondary | ICD-10-CM | POA: Insufficient documentation

## 2014-09-25 DIAGNOSIS — Z720 Tobacco use: Secondary | ICD-10-CM | POA: Insufficient documentation

## 2014-09-25 DIAGNOSIS — M62838 Other muscle spasm: Secondary | ICD-10-CM

## 2014-09-25 DIAGNOSIS — G8929 Other chronic pain: Secondary | ICD-10-CM | POA: Insufficient documentation

## 2014-09-25 DIAGNOSIS — R6883 Chills (without fever): Secondary | ICD-10-CM | POA: Insufficient documentation

## 2014-09-25 MED ORDER — CYCLOBENZAPRINE HCL 10 MG PO TABS: 10.0000 mg | ORAL_TABLET | Freq: Two times a day (BID) | ORAL | Status: AC | PRN

## 2014-09-25 MED ORDER — OXYCODONE-ACETAMINOPHEN 5-325 MG PO TABS
2.0000 | ORAL_TABLET | Freq: Once | ORAL | Status: AC
  Administered 2014-09-25: 2 via ORAL
  Filled 2014-09-25: qty 2

## 2014-09-25 MED ORDER — DIAZEPAM 5 MG/ML IJ SOLN
5.0000 mg | Freq: Once | INTRAMUSCULAR | Status: AC
  Administered 2014-09-25: 5 mg via INTRAMUSCULAR
  Filled 2014-09-25: qty 2

## 2014-09-25 MED ORDER — KETOROLAC TROMETHAMINE 30 MG/ML IJ SOLN
30.0000 mg | Freq: Once | INTRAMUSCULAR | Status: AC
  Administered 2014-09-25: 30 mg via INTRAMUSCULAR
  Filled 2014-09-25: qty 1

## 2014-09-25 MED ORDER — HYDROCODONE-ACETAMINOPHEN 5-325 MG PO TABS: 1.0000 | ORAL_TABLET | Freq: Four times a day (QID) | ORAL | Status: DC | PRN

## 2014-09-25 NOTE — ED Notes (Signed)
Presents with lower back pain began yesterday and became worse today, histroy of back pain in past, but denies injury to back recently. He is a Corporate investment bankerconstruction worker. Positioning makes pain less painful. Movement and standing straight make pain worse.

## 2014-09-25 NOTE — ED Provider Notes (Signed)
CSN: [id number]     Arrival date & time 09/25/14  1521 History  This chart was scribed for non-physician practitioner, Roxy Horseman, PA-C working with Gerhard Munch, MD by Luisa Dago, ED scribe. This patient was seen in room TR09C/TR09C and the patient's care was started at 4:41 PM.    Chief Complaint  Patient presents with  . Back Pain   The history is provided by the patient and medical records. No language interpreter was used.   HPI Comments: Jesus Diaz is a 34 y.o. male who presents to the Emergency Department with a chief complaint of lower back pain which he states is chronic but was exacerbated yesterday. Pt had a similar occurrence 2 years ago for which he followed up with a PCP. He prescribed him pain medication and told him to drink more fluids. Ever since then he has been seeing chiropractor and states that he has learned to cope with the pain. Today, he describes the pain as a "tightness" in nature which is gradually getting worse. Pt states that the pain is exacerbated when he tries to bend down to put on his shoes in the morning. He endorses associated chills. Denies any recent fall or injuries. No fever, emesis, or nausea.  History reviewed. No pertinent past medical history. Past Surgical History  Procedure Laterality Date  . Cleft lip repair     History reviewed. No pertinent family history. History  Substance Use Topics  . Smoking status: Current Some Day Smoker  . Smokeless tobacco: Not on file  . Alcohol Use: No    Review of Systems  Constitutional: Positive for chills. Negative for fever.  Respiratory: Negative for shortness of breath.   Cardiovascular: Negative for chest pain and leg swelling.  Gastrointestinal: Negative for nausea, vomiting and abdominal pain.  Genitourinary: Negative for urgency and difficulty urinating.  Musculoskeletal: Positive for back pain.  Neurological: Negative for weakness and numbness.   Allergies  Reglan  Home  Medications   Prior to Admission medications   Medication Sig Start Date End Date Taking? Authorizing Provider  doxycycline (VIBRAMYCIN) 50 MG capsule Take 2 capsules (100 mg total) by mouth 2 (two) times daily. 06/25/12   Alison Murray, MD  HYDROcodone-acetaminophen (NORCO) 10-325 MG per tablet Take 1 tablet by mouth every 6 (six) hours as needed for pain. 06/25/12   Alison Murray, MD  ibuprofen (ADVIL,MOTRIN) 200 MG tablet Take 400 mg by mouth every 6 (six) hours as needed. Pain    Historical Provider, MD   BP 128/82 mmHg  Pulse 89  Temp(Src) 97.8 F (36.6 C) (Oral)  Resp 18  Ht  (1.803 m)  Wt 185 lb (83.915 kg)  BMI 25.81 kg/m2  SpO2 97%  Physical Exam  Constitutional: He is oriented to person, place, and time. He appears well-developed and well-nourished. No distress.  HENT:  Head: Normocephalic and atraumatic.  Eyes: Conjunctivae and EOM are normal. Right eye exhibits no discharge. Left eye exhibits no discharge. No scleral icterus.  Neck: Normal range of motion. Neck supple. No tracheal deviation present.  Cardiovascular: Normal rate, regular rhythm and normal heart sounds.  Exam reveals no gallop and no friction rub.   No murmur heard. Pulmonary/Chest: Effort normal and breath sounds normal. No respiratory distress. He has no wheezes.  Abdominal: Soft. He exhibits no distension. There is no tenderness.  Musculoskeletal: Normal range of motion.  Lumbar paraspinal muscles tender to palpation, no bony tenderness, step-offs, or gross abnormality or deformity of spine,  patient is able to ambulate, moves all extremities  Bilateral great toe extension intact Bilateral plantar/dorsiflexion intact  Neurological: He is alert and oriented to person, place, and time. He has normal reflexes.  Sensation and strength intact bilaterally Symmetrical reflexes  Skin: Skin is warm and dry. He is not diaphoretic.  Psychiatric: He has a normal mood and affect. His behavior is normal.  Judgment and thought content normal.  Nursing note and vitals reviewed.   ED Course  Procedures (including critical care time)  DIAGNOSTIC STUDIES: Oxygen Saturation is 97% on RA, adequate by my interpretation.    COORDINATION OF CARE: 4:44 PM- Will give pt medication to manage and ease the pain here in the ED. Advised pt to rest and apply warm compresses to the area of discomfort. Also advised him to follow up with a back specialist to learn some therapeutic exercises to learn how to strengthen the back muscles to reduce the pain. Pt advised of plan for treatment and pt agrees.  Medications  ketorolac (TORADOL) 30 MG/ML injection 30 mg (30 mg Intramuscular Given 09/25/14 1656)  diazepam (VALIUM) injection 5 mg (5 mg Intramuscular Given 09/25/14 1656)   5:21 PM- pt rechecked 30 minutes after administration of ordered medication. Pt states that he is still in pain. Will d/c home with medication.  MDM   Final diagnoses:  Back pain, unspecified location  Muscle spasm    Patient with back pain.  No neurological deficits and normal neuro exam.  Patient is ambulatory.  No loss of bowel or bladder control.  Doubt cauda equina.  Denies fever,  doubt epidural abscess or other lesion. Recommend back exercises, stretching, RICE, and will treat in ED with valium and toradol.  Encouraged the patient that there could be a need for additional workup and/or imaging such as MRI, if the symptoms do not resolve. Patient advised that if the back pain does not resolve, or radiates, this could progress to more serious conditions and is encouraged to follow-up with PCP or orthopedics within 2 weeks.     I personally performed the services described in this documentation, which was scribed in my presence. The recorded information has been reviewed and is accurate.     Roxy Horsemanobert Rosangelica Pevehouse, PA-C 09/25/14 1731  Gerhard Munchobert Lockwood, MD 09/25/14 [phone]

## 2014-09-25 NOTE — Discharge Instructions (Signed)
Back Pain, Adult °Low back pain is very common. About 1 in 5 people have back pain. The cause of low back pain is rarely dangerous. The pain often gets better over time. About half of people with a sudden onset of back pain feel better in just 2 weeks. About 8 in 10 people feel better by 6 weeks.  °CAUSES °Some common causes of back pain include: °· Strain of the muscles or ligaments supporting the spine. °· Wear and tear (degeneration) of the spinal discs. °· Arthritis. °· Direct injury to the back. °DIAGNOSIS °Most of the time, the direct cause of low back pain is not known. However, back pain can be treated effectively even when the exact cause of the pain is unknown. Answering your caregiver's questions about your overall health and symptoms is one of the most accurate ways to make sure the cause of your pain is not dangerous. If your caregiver needs more information, he or she may order lab work or imaging tests (X-rays or MRIs). However, even if imaging tests show changes in your back, this usually does not require surgery. °HOME CARE INSTRUCTIONS °For many people, back pain returns. Since low back pain is rarely dangerous, it is often a condition that people can learn to manage on their own.  °· Remain active. It is stressful on the back to sit or stand in one place. Do not sit, drive, or stand in one place for more than 30 minutes at a time. Take short walks on level surfaces as soon as pain allows. Try to increase the length of time you walk each day. °· Do not stay in bed. Resting more than 1 or 2 days can delay your recovery. °· Do not avoid exercise or work. Your body is made to move. It is not dangerous to be active, even though your back may hurt. Your back will likely heal faster if you return to being active before your pain is gone. °· Pay attention to your body when you  bend and lift. Many people have less discomfort when lifting if they bend their knees, keep the load close to their bodies, and  avoid twisting. Often, the most comfortable positions are those that put less stress on your recovering back. °· Find a comfortable position to sleep. Use a firm mattress and lie on your side with your knees slightly bent. If you lie on your back, put a pillow under your knees. °· Only take over-the-counter or prescription medicines as directed by your caregiver. Over-the-counter medicines to reduce pain and inflammation are often the most helpful. Your caregiver may prescribe muscle relaxant drugs. These medicines help dull your pain so you can more quickly return to your normal activities and healthy exercise. °· Put ice on the injured area. °· Put ice in a plastic bag. °· Place a towel between your skin and the bag. °· Leave the ice on for 15-20 minutes, 03-04 times a day for the first 2 to 3 days. After that, ice and heat may be alternated to reduce pain and spasms. °· Ask your caregiver about trying back exercises and gentle massage. This may be of some benefit. °· Avoid feeling anxious or stressed. Stress increases muscle tension and can worsen back pain. It is important to recognize when you are anxious or stressed and learn ways to manage it. Exercise is a great option. °SEEK MEDICAL CARE IF: °· You have pain that is not relieved with rest or medicine. °· You have pain that does not improve in 1 week. °· You have new symptoms. °· You are generally not feeling well. °SEEK   IMMEDIATE MEDICAL CARE IF:  °· You have pain that radiates from your back into your legs. °· You develop new bowel or bladder control problems. °· You have unusual weakness or numbness in your arms or legs. °· You develop nausea or vomiting. °· You develop abdominal pain. °· You feel faint. °Document Released: 08/10/2005 Document Revised: 02/09/2012 Document Reviewed: 12/12/2013 °ExitCare® Patient Information ©2015 [employer]. This information is not intended to replace advice given to you by your health care provider. Make sure you  discuss any questions you have with your health care provider. ° °Muscle Cramps and Spasms °Muscle cramps and spasms occur when a muscle or muscles tighten and you have no control over this tightening (involuntary muscle contraction). They are a common problem and can develop in any muscle. The most common place is in the calf muscles of the leg. Both muscle cramps and muscle spasms are involuntary muscle contractions, but they also have differences:  °· Muscle cramps are sporadic and painful. They may last a few seconds to a quarter of an hour. Muscle cramps are often more forceful and last longer than muscle spasms. °· Muscle spasms may or may not be painful. They may also last just a few seconds or much longer. °CAUSES  °It is uncommon for cramps or spasms to be due to a serious underlying problem. In many cases, the cause of cramps or spasms is unknown. Some common causes are:  °· Overexertion.   °· Overuse from repetitive motions (doing the same thing over and over).   °· Remaining in a certain position for a long period of time.   °· Improper preparation, form, or technique while performing a sport or activity.   °· Dehydration.   °· Injury.   °· Side effects of some medicines.   °· Abnormally low levels of the salts and ions in your blood (electrolytes), especially potassium and calcium. This could happen if you are taking water pills (diuretics) or you are pregnant.   °Some underlying medical problems can make it more likely to develop cramps or spasms. These include, but are not limited to:  °· Diabetes.   °· Parkinson disease.   °· Hormone disorders, such as thyroid problems.   °· Alcohol abuse.   °· Diseases specific to muscles, joints, and bones.   °· Blood vessel disease where not enough blood is getting to the muscles.   °HOME CARE INSTRUCTIONS  °· Stay well hydrated. Drink enough water and fluids to keep your urine clear or pale yellow. °· It may be helpful to massage, stretch, and relax the affected  muscle. °· For tight or tense muscles, use a warm towel, heating pad, or hot shower water directed to the affected area. °· If you are sore or have pain after a cramp or spasm, applying ice to the affected area may relieve discomfort. °¨ Put ice in a plastic bag. °¨ Place a towel between your skin and the bag. °¨ Leave the ice on for 15-20 minutes, 03-04 times a day. °· Medicines used to treat a known cause of cramps or spasms may help reduce their frequency or severity. Only take over-the-counter or prescription medicines as directed by your caregiver. °SEEK MEDICAL CARE IF:  °Your cramps or spasms get more severe, more frequent, or do not improve over time.  °MAKE SURE YOU:  °· Understand these instructions. °· Will watch your condition. °· Will get help right away if you are not doing well or get worse. °Document Released: 01/30/2002 Document Revised: 12/05/2012 Document Reviewed: 07/27/2012 °ExitCare® Patient Information ©2015 [employer].   This information is not intended to replace advice given to you by your health care provider. Make sure you discuss any questions you have with your health care provider. ° °

## 2014-11-12 ENCOUNTER — Encounter (HOSPITAL_COMMUNITY): Payer: Self-pay | Admitting: Emergency Medicine

## 2014-11-12 ENCOUNTER — Emergency Department (HOSPITAL_COMMUNITY)
Admission: EM | Admit: 2014-11-12 | Discharge: 2014-11-12 | Discharge status: Against Medical Advice | Payer: Self-pay | Attending: Emergency Medicine | Admitting: Emergency Medicine

## 2014-11-12 DIAGNOSIS — Z720 Tobacco use: Secondary | ICD-10-CM | POA: Insufficient documentation

## 2014-11-12 DIAGNOSIS — M549 Dorsalgia, unspecified: Secondary | ICD-10-CM | POA: Insufficient documentation

## 2014-11-12 DIAGNOSIS — G8929 Other chronic pain: Secondary | ICD-10-CM | POA: Insufficient documentation
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
## 2014-11-12 NOTE — ED Notes (Signed)
Pt refused to let staff take IV out, charge came to speak with pt. Pt refusing to let charge take IV out, stating "I refuse to go wait outside with all those dogs". Shouting at charge, charge explained IV must be taken out, pt shouting asking what will happen if pt refused to let IV be taken out. Charge explained pt would be escorted out of department and charged with trespassing. Pt complied and let charge take out IV.

## 2017-10-10 ENCOUNTER — Other Ambulatory Visit: Payer: Self-pay

## 2017-10-10 ENCOUNTER — Emergency Department (HOSPITAL_COMMUNITY)
Admission: EM | Admit: 2017-10-10 | Discharge: 2017-10-10 | Disposition: A | Discharge status: Home / Self Care | Payer: Self-pay | Attending: Emergency Medicine | Admitting: Emergency Medicine

## 2017-10-10 ENCOUNTER — Encounter (HOSPITAL_COMMUNITY): Payer: Self-pay | Admitting: Emergency Medicine

## 2017-10-10 DIAGNOSIS — Z79899 Other long term (current) drug therapy: Secondary | ICD-10-CM | POA: Insufficient documentation

## 2017-10-10 DIAGNOSIS — Y92410 Unspecified street and highway as the place of occurrence of the external cause: Secondary | ICD-10-CM | POA: Insufficient documentation

## 2017-10-10 DIAGNOSIS — Y9389 Activity, other specified: Secondary | ICD-10-CM | POA: Insufficient documentation

## 2017-10-10 DIAGNOSIS — S39012A Strain of muscle, fascia and tendon of lower back, initial encounter: Secondary | ICD-10-CM | POA: Insufficient documentation

## 2017-10-10 DIAGNOSIS — F17210 Nicotine dependence, cigarettes, uncomplicated: Secondary | ICD-10-CM | POA: Insufficient documentation

## 2017-10-10 DIAGNOSIS — Y999 Unspecified external cause status: Secondary | ICD-10-CM | POA: Insufficient documentation

## 2017-10-10 HISTORY — DX: Other intervertebral disc degeneration, lumbar region: M51.36

## 2017-10-10 HISTORY — DX: Other intervertebral disc displacement, lumbar region: M51.26

## 2017-10-10 HISTORY — DX: Other intervertebral disc degeneration, lumbar region without mention of lumbar back pain or lower extremity pain: M51.369

## 2017-10-10 MED ORDER — PREDNISONE 10 MG PO TABS: ORAL_TABLET | ORAL | 0 refills | Status: AC

## 2017-10-10 MED ORDER — METHOCARBAMOL 500 MG PO TABS: 500.0000 mg | ORAL_TABLET | Freq: Two times a day (BID) | ORAL | 0 refills | Status: AC

## 2017-10-10 MED ORDER — HYDROCODONE-ACETAMINOPHEN 5-325 MG PO TABS: 2.0000 | ORAL_TABLET | ORAL | 0 refills | Status: AC | PRN

## 2017-10-10 NOTE — ED Notes (Signed)
Urine in Mini lab

## 2017-10-10 NOTE — Discharge Instructions (Signed)
Return if any problems.
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
